# Patient Record
Sex: Female | Born: 2000 | Race: White | Hispanic: No | Marital: Single | Smoking: Never smoker
Health system: Southern US, Community
[De-identification: ages and names within clinical notes are randomized; demographics above are authoritative.]

## PROBLEM LIST (undated history)

## (undated) DIAGNOSIS — F319 Bipolar disorder, unspecified: Secondary | ICD-10-CM

## (undated) DIAGNOSIS — F419 Anxiety disorder, unspecified: Secondary | ICD-10-CM

## (undated) HISTORY — PX: ADENOIDECTOMY: SUR15

## (undated) HISTORY — PX: TONSILLECTOMY: SUR1361

---
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
## 2024-01-05 ENCOUNTER — Emergency Department (HOSPITAL_BASED_OUTPATIENT_CLINIC_OR_DEPARTMENT_OTHER)
Admission: EM | Admit: 2024-01-05 | Discharge: 2024-01-05 | Disposition: A | Discharge status: Home / Self Care | Attending: Emergency Medicine | Admitting: Emergency Medicine

## 2024-01-05 ENCOUNTER — Other Ambulatory Visit: Payer: Self-pay

## 2024-01-05 ENCOUNTER — Encounter (HOSPITAL_BASED_OUTPATIENT_CLINIC_OR_DEPARTMENT_OTHER): Payer: Self-pay | Admitting: Emergency Medicine

## 2024-01-05 DIAGNOSIS — J029 Acute pharyngitis, unspecified: Secondary | ICD-10-CM | POA: Diagnosis present

## 2024-01-05 DIAGNOSIS — R197 Diarrhea, unspecified: Secondary | ICD-10-CM | POA: Diagnosis not present

## 2024-01-05 DIAGNOSIS — R059 Cough, unspecified: Secondary | ICD-10-CM | POA: Insufficient documentation

## 2024-01-05 DIAGNOSIS — R109 Unspecified abdominal pain: Secondary | ICD-10-CM | POA: Diagnosis not present

## 2024-01-05 HISTORY — DX: Anxiety disorder, unspecified: F41.9

## 2024-01-05 HISTORY — DX: Bipolar disorder, unspecified: F31.9

## 2024-01-05 LAB — RESP PANEL BY RT-PCR (RSV, FLU A&B, COVID)  RVPGX2
Influenza A by PCR: NEGATIVE
Influenza B by PCR: NEGATIVE
Resp Syncytial Virus by PCR: NEGATIVE
SARS Coronavirus 2 by RT PCR: NEGATIVE

## 2024-01-05 LAB — PREGNANCY, URINE: Preg Test, Ur: NEGATIVE

## 2024-01-05 LAB — GROUP A STREP BY PCR: Group A Strep by PCR: NOT DETECTED

## 2024-01-05 LAB — HIV ANTIBODY (ROUTINE TESTING W REFLEX): HIV Screen 4th Generation wRfx: NONREACTIVE

## 2024-01-05 NOTE — ED Triage Notes (Signed)
 Pt arrived POV, ambulatory, caox4, c/o sore throat x4 days, initially had bodyaches but subsided after 2 days, denies fever. PMH tonsillectomy.

## 2024-01-05 NOTE — Discharge Instructions (Addendum)
 You came in because you were having a sore throat. We tested for strep throat and other respiratory viruses and found that you did not have either of these. You likely have a viral infection that is causing your throat to be sore and will get better with time. Please continue to drink fluids and stay hydrated. Please return to the hospital if you have trouble swallowing and are not able to keep any food down.

## 2024-01-05 NOTE — ED Provider Notes (Signed)
  EMERGENCY DEPARTMENT AT [facility] Provider Note   CSN: [id number] Arrival date & time: 01/05/24  9263     Patient presents with: Sore Throat   Rebecca Norton is a 23 y.o. female with PMH of anxiety presents with a 4 day history of sore throat. Mother was present at bedside. Patient says she has had a constant sore throat for the last 4 days. She does not believe she has had any sick contacts. She reports that her sore throat is constant.  She has a history of tonsillectomy and adenoidectomy at the age of 6 due to getting recurrent infections.  She has been trying DayQuil, hot apple cider and using throat lozenges.  Patient reports that her highest has been 99 and denies any chills.  She reports that she has been nauseous off-and-on but has had good appetite.  She denies any vomiting but does endorse a 2-day history of diarrhea.  1 episode of diarrhea was preceded by abdominal pain.  Patient does endorse a cough with productive sputum. Color of sputum is clear except for one episode of yellow sputum.     Sore Throat       Prior to Admission medications   Medication Sig Start Date End Date Taking? Authorizing Provider  sertraline (ZOLOFT) 100 MG tablet Take 100 mg by mouth every morning. 12/01/23  Yes [provider]  amoxicillin  (AMOXIL ) 400 MG/5ML suspension 12.5 mL TID for 10 days 09/12/12   Sonda Alm BROCKS, MD  ibuprofen  (ADVIL ,MOTRIN ) 100 MG/5ML suspension Take 250 mg by mouth as needed for pain or fever.    [provider]    Allergies: Septra [sulfamethoxazole-trimethoprim]    Review of Systems Negative except as noted in HPI  Updated Vital Signs BP (!) 143/81   Pulse 99   Temp 97.8 F (36.6 C) (Oral)   Resp 20   Ht 5' 1 (1.549 m)   Wt 54.4 kg   SpO2 100%   BMI 22.67 kg/m   Physical Exam HENT:     Mouth/Throat:     Mouth: Mucous membranes are moist. No oral lesions.     Pharynx: No uvula swelling.  Cardiovascular:     Rate  and Rhythm: Normal rate.     Heart sounds: No murmur heard. Pulmonary:     Effort: Pulmonary effort is normal. No respiratory distress.     Breath sounds: No wheezing.  Abdominal:     Palpations: Abdomen is soft.     Tenderness: There is no abdominal tenderness.  Neurological:     Mental Status: She is alert.     (all labs ordered are listed, but only abnormal results are displayed) Labs Reviewed  RESP PANEL BY RT-PCR (RSV, FLU A&B, COVID)  [id number]  GROUP A STREP BY PCR    EKG: None  Radiology: No results found.   Procedures   Medications Ordered in the ED - No data to display                                  Medical Decision Making Amount and/or Complexity of Data Reviewed Labs: ordered.   Differential includes viral pharyngitis, Group A streptococcal pharyngitis, allergic/post nasal drip throat irritation, gastroesophageal reflux, EBV, epiglottitis, peritonsillar abscess, retropharyngeal abscess  Initial plan is to rule out Group Strep A and other respiratory viruses through respiratory viral panel and strep test. Will order urine pregnancy due to abdominal pain  episode. Most likely a viral pharyngitis that will take time to resolve. Patient is protecting her airway and not in any respiratory distress which is reassuring.   Strep A, pregnancy test, and respiratory panel are all negative. Likely viral pharyngitis. Was also discussed with patient that there could be other causes for her sore throat if she was sexually active. She did endorse that she was sexually active and that she was interested in HIV testing. She declined any gonorrhea or chlamydia oral swab testing at this time. HIV antibody testing sent off. Patient declined any prophylactic medication for HIV  and was advised that if she is still interested she would need to get retested for HIV in another few weeks and this [facility]. Advised patient that at this time nothing to be  done about viral pharyngitis. Drink plenty of fluids and symptom control. Return precautions discussed with patient.      Final diagnoses:  None    ED Discharge Orders     None          D'Mello, Starlet Gallentine, DO 01/05/24 9049    Dreama Longs, MD 01/05/24 2217

## 2024-01-07 ENCOUNTER — Emergency Department (HOSPITAL_BASED_OUTPATIENT_CLINIC_OR_DEPARTMENT_OTHER)
Admission: EM | Admit: 2024-01-07 | Discharge: 2024-01-07 | Disposition: A | Discharge status: Home / Self Care | Attending: Emergency Medicine | Admitting: Emergency Medicine

## 2024-01-07 ENCOUNTER — Other Ambulatory Visit: Payer: Self-pay

## 2024-01-07 ENCOUNTER — Encounter (HOSPITAL_BASED_OUTPATIENT_CLINIC_OR_DEPARTMENT_OTHER): Payer: Self-pay | Admitting: Emergency Medicine

## 2024-01-07 DIAGNOSIS — J029 Acute pharyngitis, unspecified: Secondary | ICD-10-CM | POA: Insufficient documentation

## 2024-01-07 LAB — MONONUCLEOSIS SCREEN: Mono Screen: NEGATIVE

## 2024-01-07 NOTE — ED Triage Notes (Signed)
 Pt reports she continues to have a sore throat that has gotten worse and is now affecting my ears.  I need to be checked for mono.

## 2024-01-07 NOTE — ED Provider Notes (Signed)
   [place] EMERGENCY DEPARTMENT AT [facility]  Provider Note  CSN: [id number] Arrival date & time: 01/07/24 0544  History Chief Complaint  Patient presents with   Sore Throat    Rebecca Norton is a 23 y.o. female here for re-evaluation of sore throat. Seen 2 days ago for same, had negative Covid/Flu/RSV and strep swabs. Now requesting a mono test. She has not had a fever.    Home Medications Prior to Admission medications   Medication Sig Start Date End Date Taking? Authorizing Provider  sertraline (ZOLOFT) 100 MG tablet Take 100 mg by mouth every morning. 12/01/23   [provider]     Allergies    Septra [sulfamethoxazole-trimethoprim]   Review of Systems   Review of Systems Please see HPI for pertinent positives and negatives  Physical Exam BP 131/84 (BP Location: Right Arm)   Pulse 100   Temp 97.7 F (36.5 C) (Oral)   Resp 16   Ht 5' 1 (1.549 m)   Wt 54.4 kg   SpO2 99%   BMI 22.67 kg/m   Physical Exam Vitals and nursing note reviewed.  HENT:     Head: Normocephalic.     Nose: Nose normal.     Mouth/Throat:     Pharynx: Posterior oropharyngeal erythema present. No oropharyngeal exudate.     Comments: Tonsils surgically absent Eyes:     Extraocular Movements: Extraocular movements intact.  Pulmonary:     Effort: Pulmonary effort is normal.  Musculoskeletal:        General: Normal range of motion.     Cervical back: Neck supple.  Lymphadenopathy:     Cervical: No cervical adenopathy.  Skin:    Findings: No rash (on exposed skin).  Neurological:     Mental Status: She is alert and oriented to person, place, and time.  Psychiatric:        Mood and Affect: Mood normal.     ED Results / Procedures / Treatments   EKG None  Procedures Procedures  Medications Ordered in the ED Medications - No data to display  Initial Impression and Plan  Patient with likely viral pharyngitis is requesting a mono test. Otherwise well  appearing, no signs of abscess on exam and vitals are normal.   ED Course   Clinical Course as of 01/07/24 0645  Sat Jan 07, 2024  0643 Mono is negative. Patient reassured. Recommend she continue with supportive care at home. PCP follow up, RTED for any other concerns.   [CS]    Clinical Course User Index [CS] Roselyn Carlin NOVAK, MD     MDM Rules/Calculators/A&P Medical Decision Making Problems Addressed: Sore throat: acute illness or injury  Amount and/or Complexity of Data Reviewed Labs: ordered. Decision-making details documented in ED Course.  Risk OTC drugs.     Final Clinical Impression(s) / ED Diagnoses Final diagnoses:  Sore throat    Rx / DC Orders ED Discharge Orders     None        Roselyn Carlin NOVAK, MD 01/07/24 [phone]

## 2024-03-13 ENCOUNTER — Other Ambulatory Visit: Payer: Self-pay

## 2024-03-13 ENCOUNTER — Encounter (HOSPITAL_BASED_OUTPATIENT_CLINIC_OR_DEPARTMENT_OTHER): Payer: Self-pay | Admitting: Emergency Medicine

## 2024-03-13 ENCOUNTER — Emergency Department (HOSPITAL_BASED_OUTPATIENT_CLINIC_OR_DEPARTMENT_OTHER)
Admission: EM | Admit: 2024-03-13 | Discharge: 2024-03-13 | Disposition: A | Discharge status: Home / Self Care | Attending: Emergency Medicine | Admitting: Emergency Medicine

## 2024-03-13 DIAGNOSIS — L089 Local infection of the skin and subcutaneous tissue, unspecified: Secondary | ICD-10-CM | POA: Insufficient documentation

## 2024-03-13 DIAGNOSIS — Z4800 Encounter for change or removal of nonsurgical wound dressing: Secondary | ICD-10-CM | POA: Diagnosis not present

## 2024-03-13 MED ORDER — DOXYCYCLINE HYCLATE 100 MG PO TABS
100.0000 mg | ORAL_TABLET | Freq: Once | ORAL | Status: AC
  Administered 2024-03-13: 100 mg via ORAL
  Filled 2024-03-13: qty 1

## 2024-03-13 MED ORDER — DOXYCYCLINE HYCLATE 100 MG PO CAPS: 100.0000 mg | ORAL_CAPSULE | Freq: Two times a day (BID) | ORAL | 0 refills | Status: AC

## 2024-03-13 MED ORDER — MUPIROCIN CALCIUM 2 % EX CREA: 1.0000 | TOPICAL_CREAM | Freq: Two times a day (BID) | CUTANEOUS | 0 refills | Status: AC

## 2024-03-13 NOTE — ED Notes (Signed)
 Questions and concerns addressed. Discharge teaching completed.   Prescriptions reviewed and pharmacy verified.   Pt ambulatory upon discharge.

## 2024-03-13 NOTE — ED Triage Notes (Signed)
 Patient here for evaluation of nose piercing. Nose piercing done 4 weeks ago and septum done 2 weeks ago. Reports noticing bumps around piercing and unsure if it is scar tissue or infection.  Denies fevers. Reports cleaning site as instructed.

## 2024-03-13 NOTE — ED Provider Notes (Signed)
 [place] EMERGENCY DEPARTMENT AT [facility] Provider Note   CSN: [id number] Arrival date & time: 03/13/24  1857    Patient presents with: Wound Check   Rebecca Norton is a 23 y.o. female for evaluation of wound infection.  3 weeks ago had her right nares pierced, 2 weeks ago had her septum pierced.  She has noticed bumps surrounding the jewelry.  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  She has been cleaning with antiseptic and sterile saline at home.  No fever, drainage.  No redness or warmth.  She was told by her piercer to remove the piercings however she does not want to.   HPI     Prior to Admission medications   Medication Sig Start Date End Date Taking? Authorizing Provider  doxycycline (VIBRAMYCIN) 100 MG capsule Take 1 capsule (100 mg total) by mouth 2 (two) times daily. 03/13/24  Yes Shaunna Rosetti A, PA-C  mupirocin cream (BACTROBAN) 2 % Apply 1 Application topically 2 (two) times daily. 03/13/24  Yes Maura Braaten A, PA-C  sertraline (ZOLOFT) 100 MG tablet Take 100 mg by mouth every morning. 12/01/23   [provider]    Allergies: Septra [sulfamethoxazole-trimethoprim]    Review of Systems  Constitutional: Negative.   HENT: Negative.    Respiratory: Negative.    Genitourinary: Negative.   Skin:  Positive for wound.  Neurological: Negative.   All other systems reviewed and are negative.   Updated Vital Signs BP (!) 131/92 (BP Location: Right Arm)   Pulse 79   Temp 99.9 F (37.7 C) (Oral)   Resp 18   Ht 5' 1 (1.549 m)   Wt 55.3 kg   SpO2 99%   BMI 23.05 kg/m   Physical Exam Vitals and nursing note reviewed.  Constitutional:      General: She is not in acute distress.    Appearance: She is well-developed. She is not ill-appearing, toxic-appearing or diaphoretic.  HENT:     Head: Atraumatic.     Jaw: There is normal jaw occlusion.      Nose:     Comments: 3mm rounded soft tissue nodule to right nares as well as 3 mm soft tissue rounded nodules to a septal piercing.  No obvious drainage.  Scant erythema to nodule. Eyes:     Pupils: Pupils are equal, round, and reactive to light.  Cardiovascular:     Rate and Rhythm: Normal rate.  Pulmonary:     Effort: No respiratory distress.  Abdominal:     General: There is no distension.  Musculoskeletal:        General: Normal range of motion.     Cervical back: Normal range of motion.  Skin:    General: Skin is warm and dry.  Neurological:     General: No focal deficit present.     Mental Status: She is alert.  Psychiatric:        Mood and Affect: Mood normal.    (all labs ordered are listed, but only abnormal results are displayed) Labs Reviewed - No data to display  EKG: None  Radiology: No results found.   Procedures   Medications Ordered in the ED  doxycycline (VIBRA-TABS) tablet 100 mg (has no administration in time range)   23 year old here for evaluation of possible infected nasal piercings.  Has had right nares piercing done 3 weeks ago, septal piercing  done 2 weeks ago.  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  Here she has 3 mm rounded soft tissue nodule surrounding her piercing site.  Stated apparently they were oozing however not currently.  She has no obvious large areas of cellulitis.  She was told by her piercer to remove the piercings however she has not yet.  She has no systemic symptoms.  I discussed results with patient.  Will do some antibiotics given location.  Suspect she could also be having a reaction to the person jewelry itself.  Agreed with piercer to remove piercings given possible reaction to metal as well as infection.  She will be DC home with antibiotics.  She denies chance of pregnancy.  The patient has been appropriately medically screened and/or stabilized in the ED. I have low suspicion for any other emergent medical  condition which would require further screening, evaluation or treatment in the ED or require inpatient management.  Patient is hemodynamically stable and in no acute distress.  Patient able to ambulate in department prior to ED.  Evaluation does not show acute pathology that would require ongoing or additional emergent interventions while in the emergency department or further inpatient treatment.  I have discussed the diagnosis with the patient and answered all questions.  Pain is been managed while in the emergency department and patient has no further complaints prior to discharge.  Patient is comfortable with plan discussed in room and is stable for discharge at this time.  I have discussed strict return precautions for returning to the emergency department.  Patient was encouraged to follow-up with PCP/specialist refer to at discharge.                                     Medical Decision Making Amount and/or Complexity of Data Reviewed External Data Reviewed: labs, radiology and notes.  Risk OTC drugs. Prescription drug management. Diagnosis or treatment significantly limited by social determinants of health.        Final diagnoses:  Wound infection    ED Discharge Orders          Ordered    doxycycline (VIBRAMYCIN) 100 MG capsule  2 times daily        03/13/24 2250    mupirocin cream (BACTROBAN) 2 %  2 times daily        03/13/24 2250               Biran Mayberry A, PA-C 03/13/24 2300    Dreama Longs, MD 03/15/24 1407

## 2024-03-13 NOTE — Discharge Instructions (Addendum)
 Take the antibiotics as prescribed.  You may need to pull your piercings as your piercer recommended  Follow-up outpatient, return for any worsening symptoms

## 2024-04-25 ENCOUNTER — Emergency Department (HOSPITAL_BASED_OUTPATIENT_CLINIC_OR_DEPARTMENT_OTHER)
Admission: EM | Admit: 2024-04-25 | Discharge: 2024-04-25 | Disposition: A | Discharge status: Home / Self Care | Attending: Emergency Medicine | Admitting: Emergency Medicine

## 2024-04-25 ENCOUNTER — Other Ambulatory Visit: Payer: Self-pay

## 2024-04-25 DIAGNOSIS — R509 Fever, unspecified: Secondary | ICD-10-CM | POA: Diagnosis present

## 2024-04-25 DIAGNOSIS — J069 Acute upper respiratory infection, unspecified: Secondary | ICD-10-CM | POA: Diagnosis not present

## 2024-04-25 DIAGNOSIS — J029 Acute pharyngitis, unspecified: Secondary | ICD-10-CM

## 2024-04-25 LAB — RESP PANEL BY RT-PCR (RSV, FLU A&B, COVID)  RVPGX2
Influenza A by PCR: NEGATIVE
Influenza B by PCR: NEGATIVE
Resp Syncytial Virus by PCR: NEGATIVE
SARS Coronavirus 2 by RT PCR: NEGATIVE

## 2024-04-25 LAB — GROUP A STREP BY PCR: Group A Strep by PCR: NOT DETECTED

## 2024-04-25 MED ORDER — IBUPROFEN 400 MG PO TABS
400.0000 mg | ORAL_TABLET | Freq: Once | ORAL | Status: AC
  Administered 2024-04-25: 400 mg via ORAL
  Filled 2024-04-25: qty 1

## 2024-04-25 NOTE — ED Provider Notes (Signed)
 [place] EMERGENCY DEPARTMENT AT [facility] Provider Note   CSN: [id number] Arrival date & time: 04/25/24  0800     Patient presents with: Sore Throat   MAIRIN LINDSLEY is a 23 y.o. female.   Pt with c/o sore throat, nasal congestion, occasional non prod cough, in the past 2-3 days. Low grade fever. No specific known ill contacts. No trouble breathing or swallowing. No severe headaches. No neck pain or stiffness. No rash.   The history is provided by the patient and medical records.  Sore Throat Pertinent negatives include no chest pain, no abdominal pain, no headaches and no shortness of breath.       Prior to Admission medications   Medication Sig Start Date End Date Taking? Authorizing Provider  doxycycline  (VIBRAMYCIN ) 100 MG capsule Take 1 capsule (100 mg total) by mouth 2 (two) times daily. 03/13/24   Henderly, Britni A, PA-C  mupirocin  cream (BACTROBAN ) 2 % Apply 1 Application topically 2 (two) times daily. 03/13/24   Henderly, Britni A, PA-C  sertraline (ZOLOFT) 100 MG tablet Take 100 mg by mouth every morning. 12/01/23   [provider]    Allergies: Septra [sulfamethoxazole-trimethoprim]    Review of Systems  Constitutional:  Positive for fever.  HENT:  Positive for congestion, rhinorrhea and sore throat. Negative for trouble swallowing.   Eyes:  Negative for pain, discharge and redness.  Respiratory:  Positive for cough. Negative for shortness of breath.   Cardiovascular:  Negative for chest pain and leg swelling.  Gastrointestinal:  Negative for abdominal pain, diarrhea and vomiting.  Genitourinary:  Negative for dysuria and flank pain.  Musculoskeletal:  Negative for neck pain and neck stiffness.  Skin:  Negative for rash.  Neurological:  Negative for headaches.    Updated Vital Signs BP 119/70   Pulse 92   Temp 99.9 F (37.7 C) (Oral)   Resp 16   SpO2 100%   Physical Exam Vitals and nursing note reviewed.  Constitutional:       Appearance: Normal appearance. She is well-developed.  HENT:     Head: Atraumatic.     Right Ear: Tympanic membrane normal.     Left Ear: Tympanic membrane normal.     Nose: Congestion present.     Mouth/Throat:     Mouth: Mucous membranes are moist.     Pharynx: Oropharynx is clear. Posterior oropharyngeal erythema present.     Comments: Pharynx mildly erythematous. No asymmetric swelling or abscess. No trismus.  Eyes:     General: No scleral icterus.    Conjunctiva/sclera: Conjunctivae normal.  Neck:     Trachea: No tracheal deviation.     Comments: Neck supple, no stiffness or rigidity. Trachea midline. No neck mass, swelling, or significant l/a noted.  Cardiovascular:     Rate and Rhythm: Regular rhythm. Tachycardia present.     Pulses: Normal pulses.     Heart sounds: Normal heart sounds. No murmur heard.    No friction rub. No gallop.  Pulmonary:     Effort: Pulmonary effort is normal. No respiratory distress.     Breath sounds: Normal breath sounds.  Abdominal:     General: Bowel sounds are normal. There is no distension.     Palpations: Abdomen is soft.     Tenderness: There is no abdominal tenderness.     Comments: No hsm.   Genitourinary:    Comments: No cva tenderness.  Musculoskeletal:        General: No swelling or  tenderness.     Cervical back: Normal range of motion and neck supple. No rigidity. No muscular tenderness.     Right lower leg: No edema.     Left lower leg: No edema.  Lymphadenopathy:     Cervical: No cervical adenopathy.  Skin:    General: Skin is warm and dry.     Findings: No rash.  Neurological:     Mental Status: She is alert.     Comments: Alert, speech normal. Steady gait.   Psychiatric:        Mood and Affect: Mood normal.     (all labs ordered are listed, but only abnormal results are displayed) Results for orders placed or performed during the hospital encounter of 04/25/24  Group A Strep by PCR if patient complains of sore  throat.   Collection Time: 04/25/24  8:10 AM   Specimen: Throat; Sterile Swab  Result Value Ref Range   Group A Strep by PCR NOT DETECTED NOT DETECTED  Resp panel by RT-PCR (RSV, Flu A&B, Covid) Anterior Nasal Swab   Collection Time: 04/25/24  8:10 AM   Specimen: Anterior Nasal Swab  Result Value Ref Range   SARS Coronavirus 2 by RT PCR NEGATIVE NEGATIVE   Influenza A by PCR NEGATIVE NEGATIVE   Influenza B by PCR NEGATIVE NEGATIVE   Resp Syncytial Virus by PCR NEGATIVE NEGATIVE     EKG: None  Radiology: No results found.   Procedures   Medications Ordered in the ED - No data to display                                  Medical Decision Making Problems Addressed: Pharyngitis, unspecified etiology: acute illness or injury Viral URI: acute illness or injury with systemic symptoms that poses a threat to life or bodily functions  Amount and/or Complexity of Data Reviewed External Data Reviewed: notes. Labs: ordered. Decision-making details documented in ED Course.  Risk Prescription drug management.   Labs sent.   Reviewed nursing notes and prior charts for additional history.   Pt took acetaminophen ~ 4 hours ago.  Ibuprofen  po. Po fluids.   Labs reviewed/interpreted by me - covid and flu neg. Strep neg.  Pt appears stable for ed d/c.   Return precautions provided.      Final diagnoses:  None    ED Discharge Orders     None          Bernard Drivers, MD 04/25/24 [phone]

## 2024-04-25 NOTE — ED Triage Notes (Signed)
 Patient states fever and sore throat today.

## 2024-04-25 NOTE — Discharge Instructions (Addendum)
 It was our pleasure to provide your ER care today - we hope that you feel better.  Your covid and flu and strep tests are negative.    Your symptoms are most likely due to a viral upper respiratory  type of illness that should run its course and get better in the next 3-5 days.   Drink plenty of fluids/stay well hydrated.  You may try acetaminophen, ibuprofen , and/or throat lozenges as need for symptom relief.   Follow up with primary care doctor in one-two weeks if symptoms fail to improve/resolve.  Return to ER if worse, new symptoms, increased trouble breathing, unable to  swallow, or other emergency concern.
